# Patient Record
Sex: Female | Born: 1970
Health system: Southern US, Community
[De-identification: ages and names within clinical notes are randomized; demographics above are authoritative.]

---

## 2003-11-05 ENCOUNTER — Emergency Department: Payer: Self-pay | Admitting: General Practice

## 2007-12-16 ENCOUNTER — Emergency Department: Payer: Self-pay | Admitting: Emergency Medicine

## 2008-01-23 ENCOUNTER — Emergency Department: Payer: Self-pay | Admitting: Emergency Medicine

## 2008-07-04 ENCOUNTER — Emergency Department: Payer: Self-pay | Admitting: Emergency Medicine

## 2008-10-22 ENCOUNTER — Emergency Department: Payer: Self-pay | Admitting: Emergency Medicine

## 2009-08-01 ENCOUNTER — Emergency Department: Payer: Self-pay | Admitting: Emergency Medicine

## 2009-08-03 ENCOUNTER — Emergency Department: Payer: Self-pay | Admitting: Emergency Medicine

## 2010-09-14 ENCOUNTER — Emergency Department: Payer: Self-pay | Admitting: *Deleted

## 2010-12-15 IMAGING — CR RIGHT FOREARM - 2 VIEW
1 series · 3 of 3 positions shown · non-contrast
Comparison: none

REASON FOR EXAM: pain
COMMENTS:

PROCEDURE:     DXR - DXR FOREARM RIGHT  - October 22, 2008  [DATE]
RESULT:     AP and lateral views of the right forearm are submitted. The
shafts of the radius and ulna appear intact. The observed portions of the
wrist and elbow exhibit no acute abnormality.

[Series 1: view not recorded · 0.17mm/px · 3 of 3 slices shown]
[im 1/3]
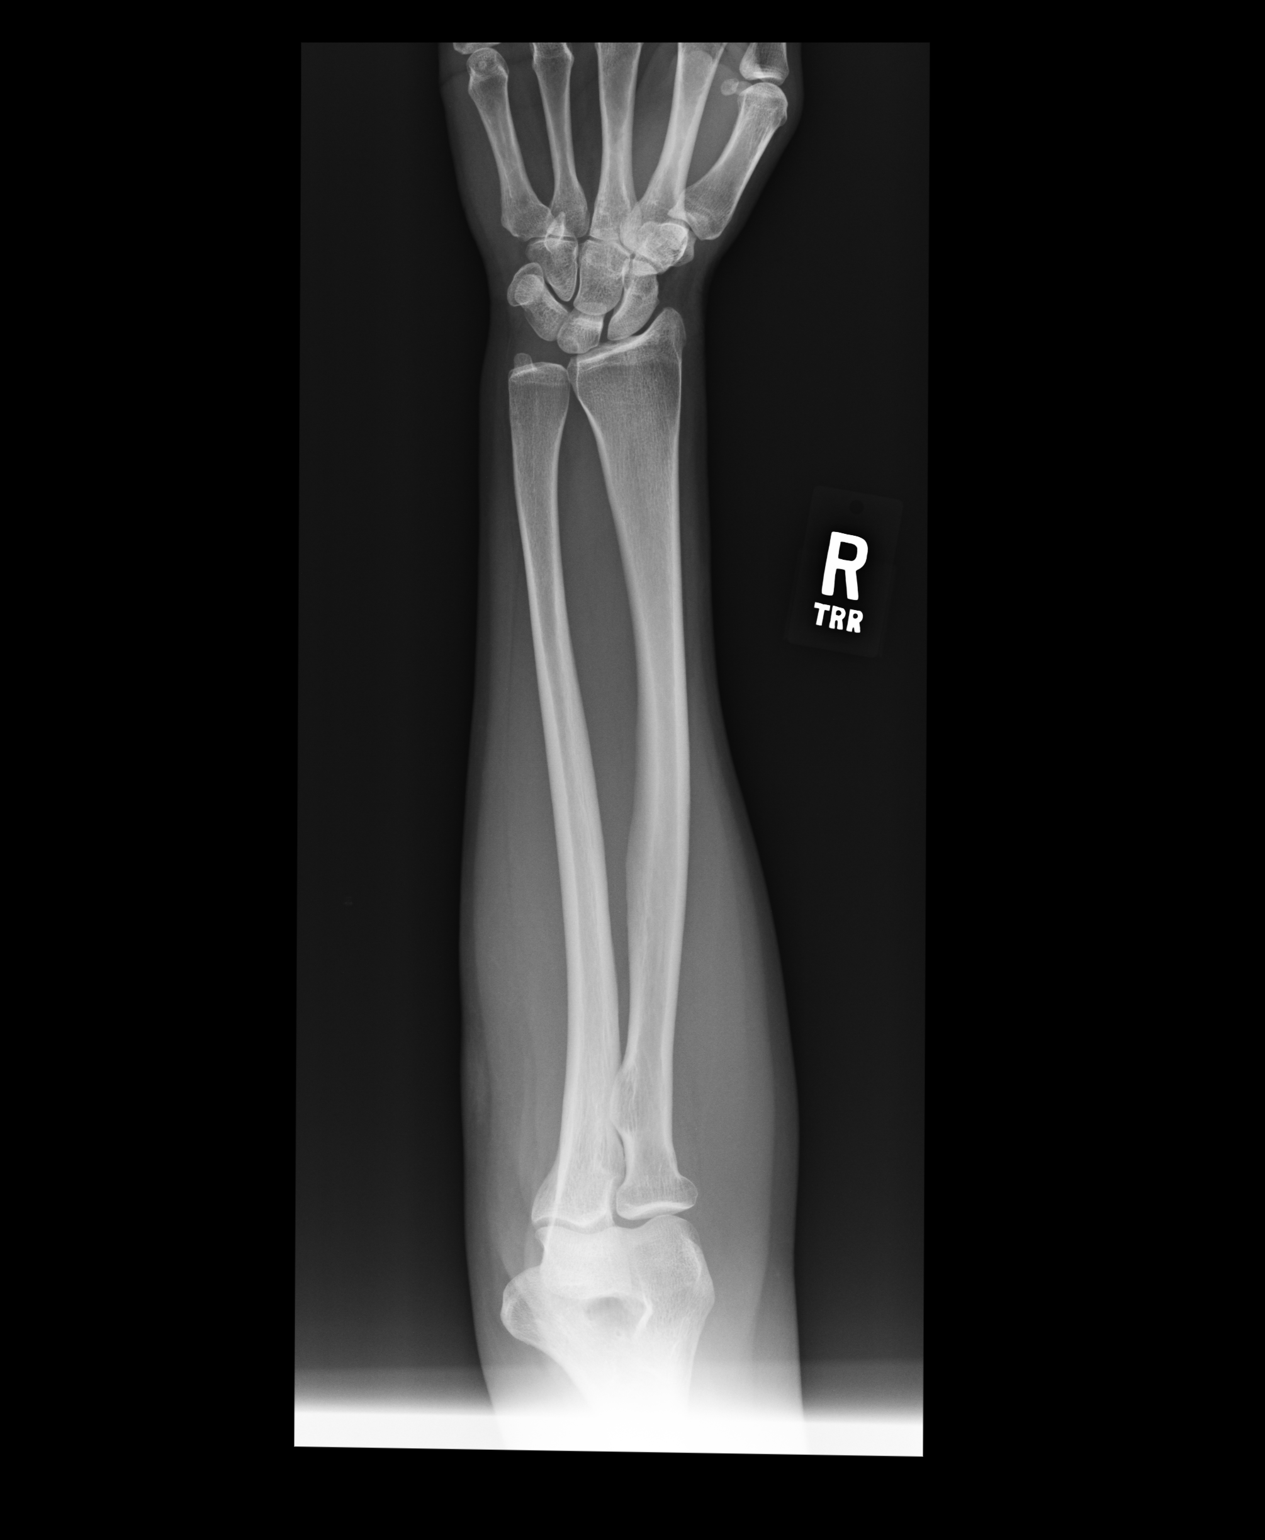
[im 2/3]
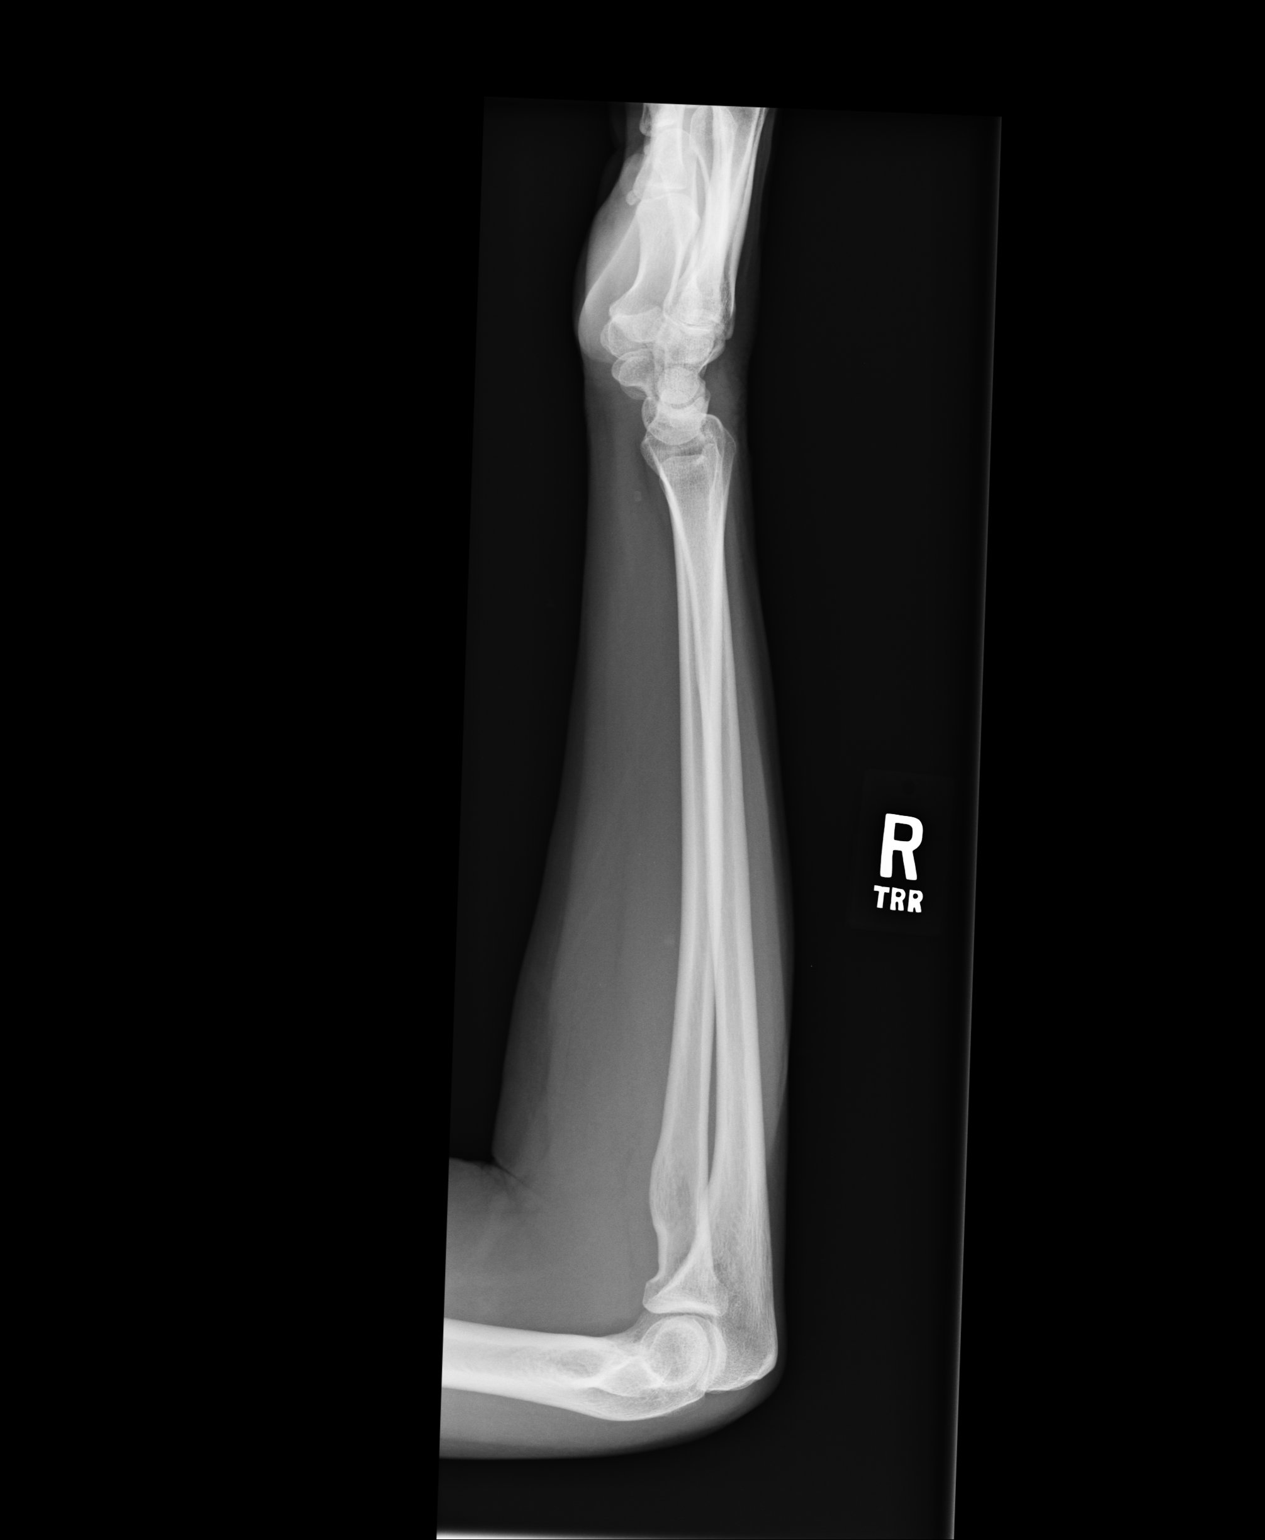
[im 3/3]
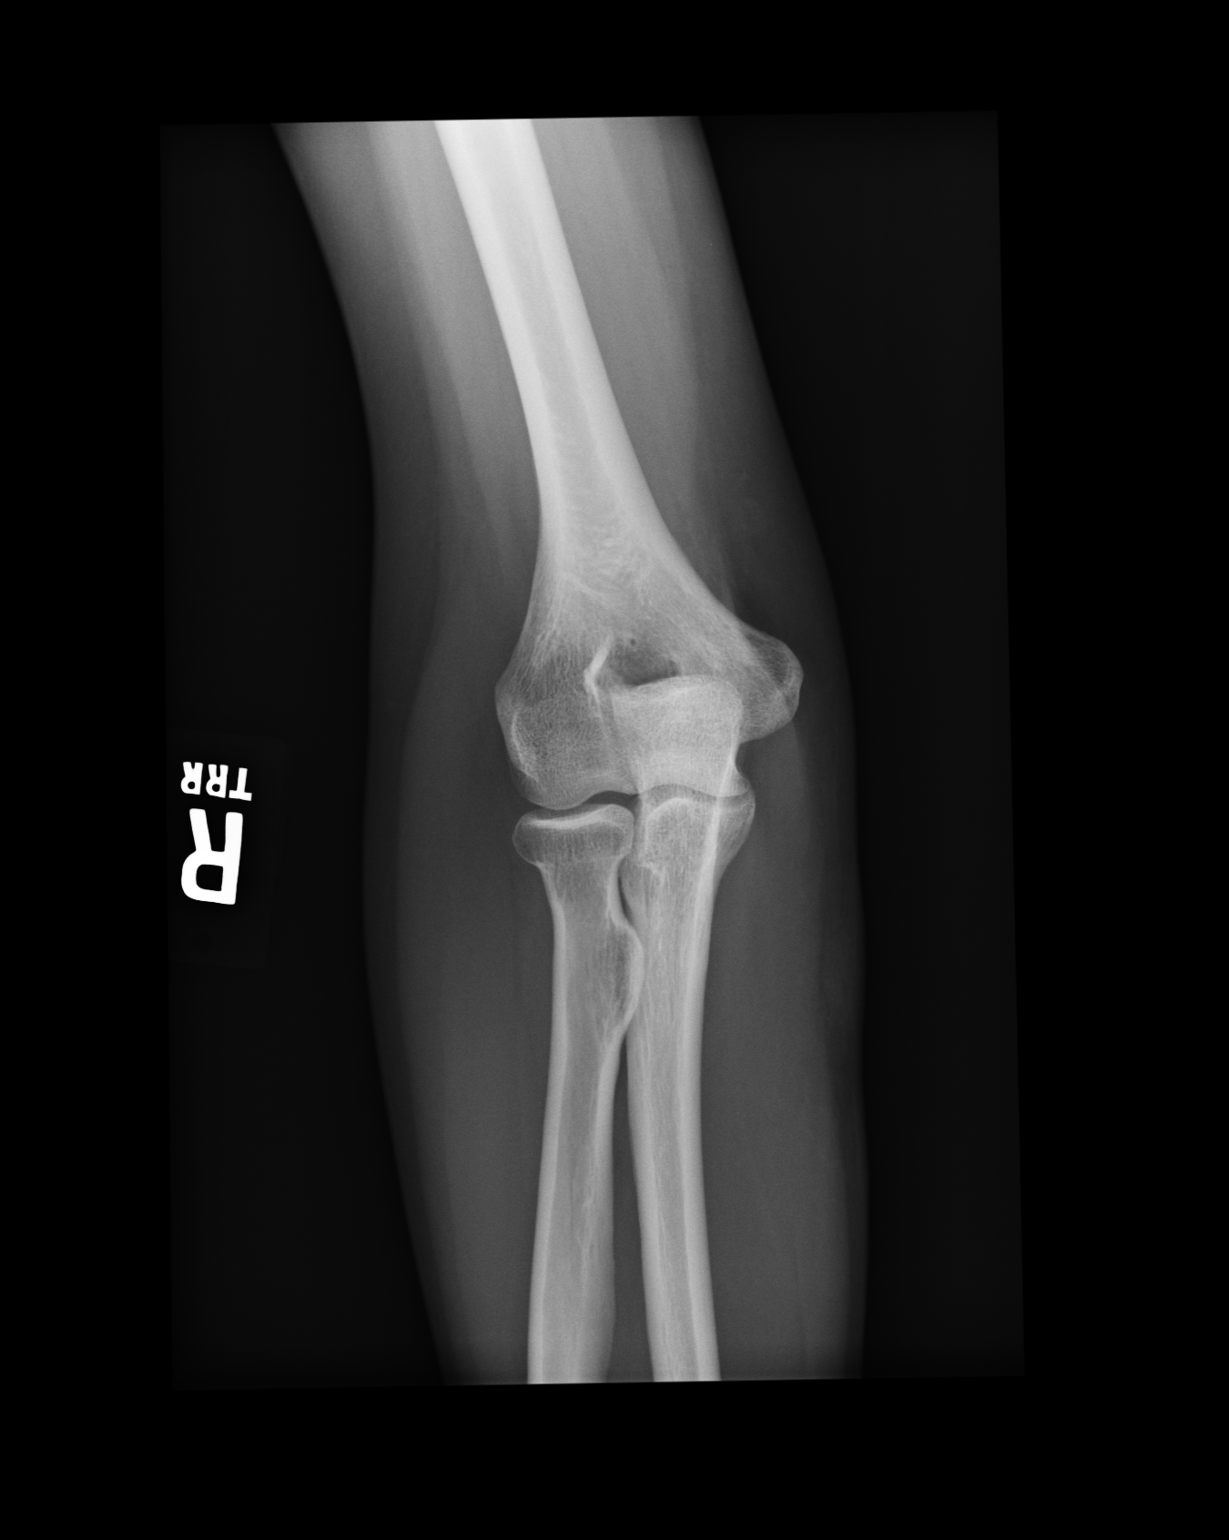

[3 of 3 positions shown; findings below may reference images not displayed]

IMPRESSION: I do not see objective evidence of acute fracture of the
forearm. If there are strong clinical concerns of elbow or wrist pathology,
dedicated x-ray series of these areas would be useful.

## 2011-01-11 ENCOUNTER — Emergency Department: Payer: Self-pay | Admitting: Emergency Medicine

## 2011-04-30 ENCOUNTER — Emergency Department: Payer: Self-pay | Admitting: Emergency Medicine

## 2012-05-12 ENCOUNTER — Emergency Department: Payer: Self-pay | Admitting: Emergency Medicine

## 2012-07-21 ENCOUNTER — Emergency Department: Payer: Self-pay | Admitting: Emergency Medicine

## 2013-04-06 ENCOUNTER — Emergency Department: Payer: Self-pay | Admitting: Emergency Medicine

## 2020-01-14 ENCOUNTER — Other Ambulatory Visit: Payer: Self-pay

## 2020-01-14 ENCOUNTER — Emergency Department: Payer: HRSA Program

## 2020-01-14 ENCOUNTER — Emergency Department
Admission: EM | Admit: 2020-01-14 | Discharge: 2020-01-14 | Disposition: A | Discharge status: Home / Self Care | Payer: HRSA Program | Attending: Emergency Medicine | Admitting: Emergency Medicine

## 2020-01-14 DIAGNOSIS — U071 COVID-19: Secondary | ICD-10-CM

## 2020-01-14 DIAGNOSIS — J181 Lobar pneumonia, unspecified organism: Secondary | ICD-10-CM | POA: Diagnosis not present

## 2020-01-14 DIAGNOSIS — J189 Pneumonia, unspecified organism: Secondary | ICD-10-CM

## 2020-01-14 DIAGNOSIS — Z79899 Other long term (current) drug therapy: Secondary | ICD-10-CM | POA: Insufficient documentation

## 2020-01-14 DIAGNOSIS — J069 Acute upper respiratory infection, unspecified: Secondary | ICD-10-CM | POA: Diagnosis present

## 2020-01-14 LAB — RESP PANEL BY RT-PCR (FLU A&B, COVID) ARPGX2
Influenza A by PCR: NEGATIVE
Influenza B by PCR: NEGATIVE
SARS Coronavirus 2 by RT PCR: POSITIVE — AB

## 2020-01-14 MED ORDER — ALBUTEROL SULFATE HFA 108 (90 BASE) MCG/ACT IN AERS: 2.0000 | INHALATION_SPRAY | RESPIRATORY_TRACT | 0 refills | Status: AC | PRN

## 2020-01-14 MED ORDER — PREDNISONE 20 MG PO TABS
60.0000 mg | ORAL_TABLET | Freq: Once | ORAL | Status: AC
  Administered 2020-01-14: 20:00:00 60 mg via ORAL
  Filled 2020-01-14: qty 3

## 2020-01-14 MED ORDER — PREDNISONE 10 MG PO TABS: 10.0000 mg | ORAL_TABLET | Freq: Every day | ORAL | 0 refills | Status: AC

## 2020-01-14 MED ORDER — AZITHROMYCIN 500 MG PO TABS
500.0000 mg | ORAL_TABLET | Freq: Once | ORAL | Status: AC
  Administered 2020-01-14: 20:00:00 500 mg via ORAL
  Filled 2020-01-14: qty 1

## 2020-01-14 MED ORDER — PSEUDOEPH-BROMPHEN-DM 30-2-10 MG/5ML PO SYRP: 10.0000 mL | ORAL_SOLUTION | Freq: Four times a day (QID) | ORAL | 0 refills | Status: AC | PRN

## 2020-01-14 MED ORDER — AZITHROMYCIN 250 MG PO TABS: ORAL_TABLET | ORAL | 0 refills | Status: AC

## 2020-01-14 NOTE — ED Provider Notes (Signed)
[facility] Emergency Department Provider Note  ____________________________________________  Time seen: Approximately 5:32 PM  I have reviewed the triage vital signs and the nursing notes.   HISTORY  Chief Complaint URI    HPI Monique Chavez is a 49 y.o. female who presents the emergency department complaining of nasal congestion, headaches, cough, sore throat x2 days.  Patient denies any recent sick contacts.  She has not had the flu or Covid vaccine this year.  Patient states that her cough is productive in nature.  No GI complaints.  No urinary complaints.  No medications prior to arrival.         No past medical history on file.  There are no problems to display for this patient.     Prior to Admission medications   Medication Sig Start Date End Date Taking? Authorizing Provider  albuterol (VENTOLIN HFA) 108 (90 Base) MCG/ACT inhaler Inhale 2 puffs into the lungs every 4 (four) hours as needed for wheezing or shortness of breath. 01/14/20   Alexina Niccoli, Delorise Royals, PA-C  azithromycin (ZITHROMAX Z-PAK) 250 MG tablet Take 2 tablets (500 mg) on  Day 1,  followed by 1 tablet (250 mg) once daily on Days 2 through 5. 01/14/20   Laterrance Nauta, Delorise Royals, PA-C  brompheniramine-pseudoephedrine-DM 30-2-10 MG/5ML syrup Take 10 mLs by mouth 4 (four) times daily as needed. 01/14/20   Abreanna Drawdy, Delorise Royals, PA-C  predniSONE (DELTASONE) 10 MG tablet Take 1 tablet (10 mg total) by mouth daily. 01/14/20   Leshaun Biebel, Delorise Royals, PA-C    Allergies Patient has no known allergies.  No family history on file.  Social History     Review of Systems  Constitutional: No fever/chills Eyes: No visual changes. No discharge ENT: Nasal congestion and sore throat Cardiovascular: no chest pain. Respiratory: Positive for productive cough. No SOB. Gastrointestinal: No abdominal pain.  No nausea, no vomiting.  No diarrhea.  No constipation. Musculoskeletal: Negative for  musculoskeletal pain. Skin: Negative for rash, abrasions, lacerations, ecchymosis. Neurological: Positive for headache but denies focal weakness or numbness.  10 System ROS otherwise negative.  ____________________________________________   PHYSICAL EXAM:  VITAL SIGNS: ED Triage Vitals  Enc Vitals Group     BP --      Pulse --      Resp 01/14/20 1524 (!) 22     Temp 01/14/20 1524 98.3 F (36.8 C)     Temp Source 01/14/20 1524 Oral     SpO2 01/14/20 1524 100 %     Weight 01/14/20 1521 150 lb (68 kg)     Height 01/14/20 1521 5\' 9"  (1.753 m)     Head Circumference --      Peak Flow --      Pain Score 01/14/20 1520 7     Pain Loc --      Pain Edu? --      Excl. in GC? --      Constitutional: Alert and oriented. Well appearing and in no acute distress. Eyes: Conjunctivae are normal. PERRL. EOMI. Head: Atraumatic. ENT:      Ears: EACs and TMs unremarkable bilaterally      Nose: Moderate congestion/rhinnorhea.      Mouth/Throat: Mucous membranes are moist.  Oropharynx is nonerythematous and nonedematous.  Uvula is midline. Neck: No stridor.  Neck is supple full range of motion Hematological/Lymphatic/Immunilogical: No cervical lymphadenopathy. Cardiovascular: Normal rate, regular rhythm. Normal S1 and S2.  Good peripheral circulation. Respiratory: Normal respiratory effort without tachypnea or retractions. Lungs  CTAB. Good air entry to the bases with no decreased or absent breath sounds. Gastrointestinal: Bowel sounds 4 quadrants. Soft and nontender to palpation. No guarding or rigidity. No palpable masses. No distention.  Musculoskeletal: Full range of motion to all extremities. No gross deformities appreciated. Neurologic:  Normal speech and language. No gross focal neurologic deficits are appreciated.  Skin:  Skin is warm, dry and intact. No rash noted. Psychiatric: Mood and affect are normal. Speech and behavior are normal. Patient exhibits appropriate insight and  judgement.   ____________________________________________   LABS (all labs ordered are listed, but only abnormal results are displayed)  Labs Reviewed  RESP PANEL BY RT-PCR (FLU A&B, COVID) ARPGX2 - Abnormal; Notable for the following components:      Result Value   SARS Coronavirus 2 by RT PCR POSITIVE (*)    All other components within normal limits   ____________________________________________  EKG   ____________________________________________  RADIOLOGY I personally viewed and evaluated these images as part of my medical decision making, as well as reviewing the written report by the radiologist.  ED Provider Interpretation: I concur with opacities in the left lower lung concerning for left lower lobe pneumonia  DG Chest 2 View  Result Date: 01/14/2020 CLINICAL DATA:  Cough, body aches, chest congestion, and loss of taste and smell. EXAM: CHEST - 2 VIEW COMPARISON:  Chest x-ray dated August 02, 2019. FINDINGS: The heart size and mediastinal contours are within normal limits. Normal pulmonary vascularity. Patchy hazy opacities in the left lower lobe. No pleural effusion or pneumothorax. No acute osseous abnormality. IMPRESSION: 1. Left lower lobe pneumonia. Electronically Signed   By: Obie Dredge M.D.   On: 01/14/2020 18:33    ____________________________________________    PROCEDURES  Procedure(s) performed:    Procedures    Medications  predniSONE (DELTASONE) tablet 60 mg (has no administration in time range)  azithromycin (ZITHROMAX) tablet 500 mg (has no administration in time range)     ____________________________________________   INITIAL IMPRESSION / ASSESSMENT AND PLAN / ED COURSE  Pertinent labs & imaging results that were available during my care of the patient were reviewed by me and considered in my medical decision making (see chart for details).  Review of the [place] CSRS was performed in accordance of the [facility] prior to dispensing any  controlled drugs.           Patient's diagnosis is consistent with COVID-19, left lower lobe pneumonia.  Patient presented to the emergency department with a couple day history of multiple viral symptoms.  Patient had no recent sick contacts to her knowledge.  On my exam, patient had no increased respiratory effort, reassuring lung sounds.  My suspicion was for viral illness of COVID-19 versus pneumonia versus viral URI.  Chest x-ray showed left lower lobe pneumonia.  Without the typical patchy findings, I am concerned the patient may have a bacterial pneumonia as well as a positive Covid test.  As such I will treat patient with prednisone, albuterol and Bromfed for viral symptoms as well as place the patient on azithromycin for possible overlying bacterial pneumonia.  Chest x-ray also may be just viral pneumonia given the COVID-19 status and limited days of symptoms.  But again at this time I feel like patient should be covered with an antibiotic as well.  Strict return precautions are discussed with the patient for any worsening respiratory symptoms or complaints.  Fluids, Tylenol and Motrin in addition to prescription should be used for symptom relief.  Follow-up with primary care as needed..  Patient is given ED precautions to return to the ED for any worsening or new symptoms.     ____________________________________________  FINAL CLINICAL IMPRESSION(S) / ED DIAGNOSES  Final diagnoses:  COVID-19  Community acquired pneumonia of left lower lobe of lung      NEW MEDICATIONS STARTED DURING THIS VISIT:  ED Discharge Orders         Ordered    predniSONE (DELTASONE) 10 MG tablet  Daily       Note to Pharmacy: Take 6 pills x 2 days, 5 pills x 2 days, 4 pills x 2 days, 3 pills x 2 days, 2 pills x 2 days, and 1 pill x 2 days   01/14/20 1945    albuterol (VENTOLIN HFA) 108 (90 Base) MCG/ACT inhaler  Every 4 hours PRN        01/14/20 1945    azithromycin (ZITHROMAX Z-PAK) 250 MG tablet         01/14/20 1945    brompheniramine-pseudoephedrine-DM 30-2-10 MG/5ML syrup  4 times daily PRN        01/14/20 1945              This chart was dictated using voice recognition software/Dragon. Despite best efforts to proofread, errors can occur which can change the meaning. Any change was purely unintentional.    Racheal Patches, PA-C 01/14/20 1947    Arnaldo Natal, MD 01/15/20 [phone]

## 2020-01-14 NOTE — ED Triage Notes (Signed)
C/O chest and sinus congestions.  Loss of taste and smell.  Symptom onset 2-3 days.

## 2022-03-08 IMAGING — CR DG CHEST 2V
1 series · 2 of 2 positions shown · non-contrast
Comparison: Chest x-ray dated August 02, 2019.

CLINICAL DATA: Cough, body aches, chest congestion, and loss of
taste and smell.

EXAM:
CHEST - 2 VIEW

[Series 1: w chest pa · 0.14mm/px · 2 of 2 slices shown]
[im 1/2]
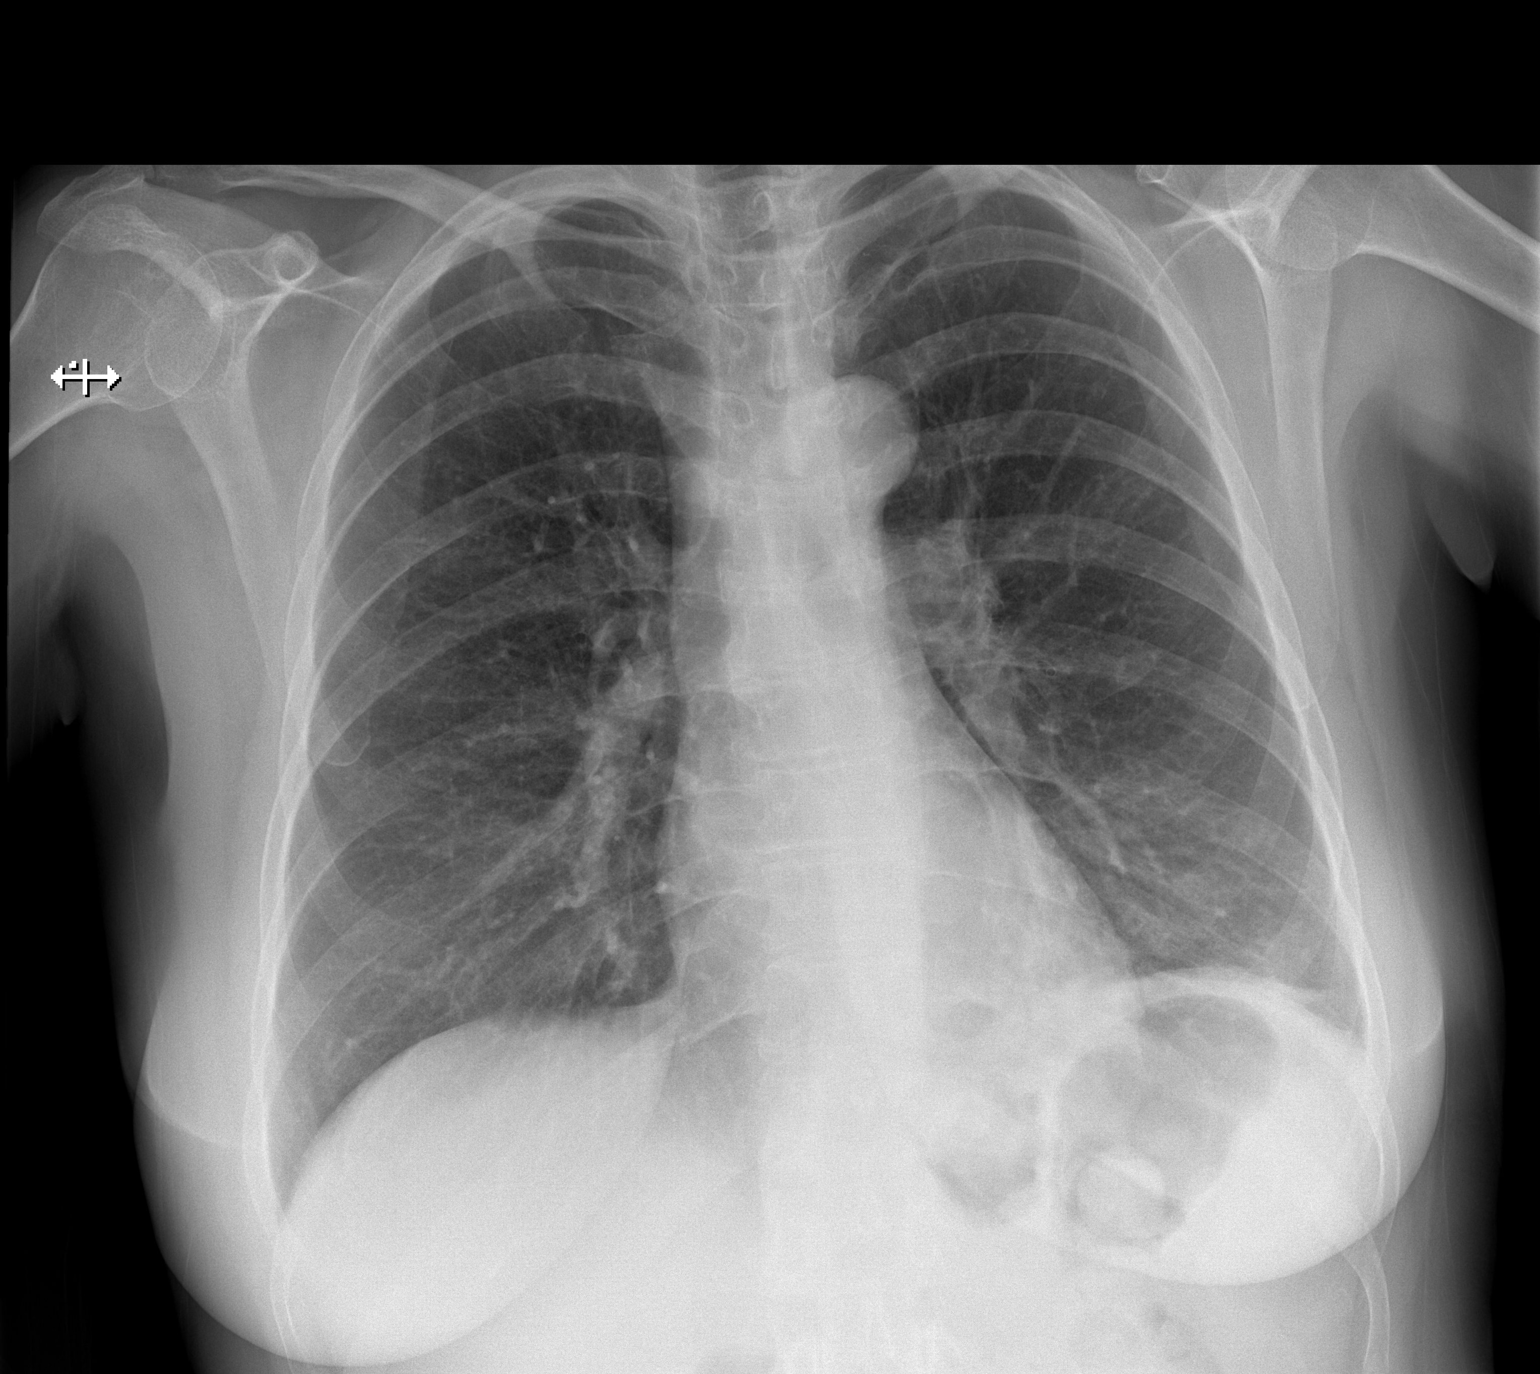
[im 2/2]
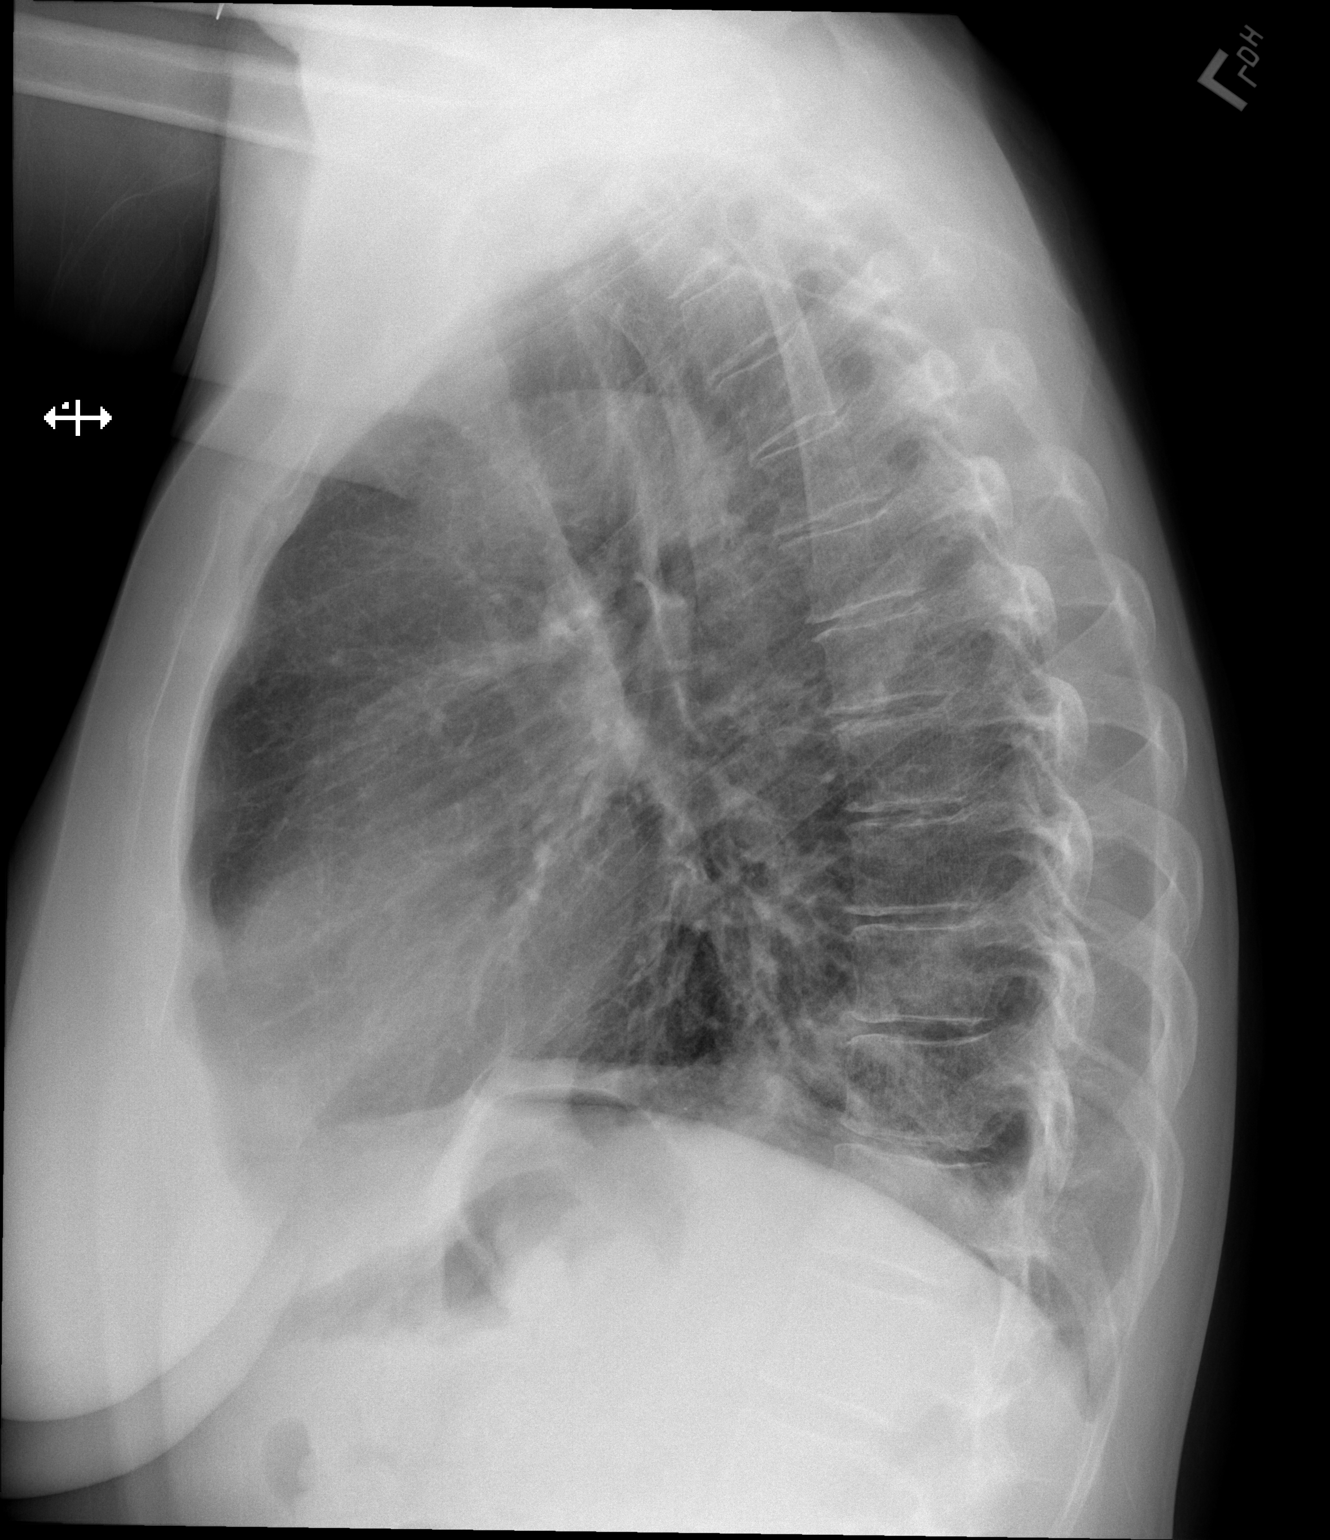

[2 of 2 positions shown; findings below may reference images not displayed]

FINDINGS: The heart size and mediastinal contours are within normal limits.
Normal pulmonary vascularity. Patchy hazy opacities in the left
lower lobe. No pleural effusion or pneumothorax. No acute osseous
abnormality.
IMPRESSION: 1. Left lower lobe pneumonia.

## 2022-04-21 DIAGNOSIS — B182 Chronic viral hepatitis C: Secondary | ICD-10-CM | POA: Diagnosis not present

## 2022-04-21 DIAGNOSIS — Z1389 Encounter for screening for other disorder: Secondary | ICD-10-CM | POA: Diagnosis not present

## 2022-04-21 DIAGNOSIS — F1120 Opioid dependence, uncomplicated: Secondary | ICD-10-CM | POA: Diagnosis not present

## 2022-04-21 DIAGNOSIS — F332 Major depressive disorder, recurrent severe without psychotic features: Secondary | ICD-10-CM | POA: Diagnosis not present

## 2022-05-19 DIAGNOSIS — F1120 Opioid dependence, uncomplicated: Secondary | ICD-10-CM | POA: Diagnosis not present

## 2022-05-19 DIAGNOSIS — F331 Major depressive disorder, recurrent, moderate: Secondary | ICD-10-CM | POA: Diagnosis not present

## 2022-12-22 ENCOUNTER — Other Ambulatory Visit: Payer: Self-pay | Admitting: Nurse Practitioner

## 2022-12-22 DIAGNOSIS — Z1231 Encounter for screening mammogram for malignant neoplasm of breast: Secondary | ICD-10-CM

## 2023-03-20 ENCOUNTER — Other Ambulatory Visit: Payer: Self-pay | Admitting: Nurse Practitioner

## 2023-03-20 DIAGNOSIS — R7989 Other specified abnormal findings of blood chemistry: Secondary | ICD-10-CM

## 2023-03-20 DIAGNOSIS — B182 Chronic viral hepatitis C: Secondary | ICD-10-CM

## 2023-05-09 ENCOUNTER — Other Ambulatory Visit: Payer: Self-pay | Admitting: Nurse Practitioner

## 2023-05-09 DIAGNOSIS — Z8619 Personal history of other infectious and parasitic diseases: Secondary | ICD-10-CM

## 2023-05-15 ENCOUNTER — Ambulatory Visit: Attending: Nurse Practitioner

## 2023-10-23 ENCOUNTER — Other Ambulatory Visit: Payer: Self-pay | Admitting: Nurse Practitioner

## 2023-10-23 DIAGNOSIS — B182 Chronic viral hepatitis C: Secondary | ICD-10-CM

## 2023-10-30 ENCOUNTER — Ambulatory Visit
Admission: RE | Admit: 2023-10-30 | Discharge: 2023-10-30 | Disposition: A | Discharge status: Home / Self Care | Source: Ambulatory Visit | Attending: Nurse Practitioner | Admitting: Nurse Practitioner

## 2023-10-30 DIAGNOSIS — B182 Chronic viral hepatitis C: Secondary | ICD-10-CM | POA: Diagnosis present
